# Patient Record
Sex: Female | Born: 1965 | Hispanic: Yes | Marital: Single | Smoking: Never smoker
Health system: Southern US, Community
[De-identification: ages and names within clinical notes are randomized; demographics above are authoritative.]

---

## 2004-12-05 ENCOUNTER — Ambulatory Visit: Payer: Self-pay | Admitting: Family Medicine

## 2005-04-24 ENCOUNTER — Observation Stay: Payer: Self-pay | Admitting: Obstetrics and Gynecology

## 2005-04-25 ENCOUNTER — Inpatient Hospital Stay: Payer: Self-pay | Admitting: Obstetrics and Gynecology

## 2005-06-30 ENCOUNTER — Emergency Department: Payer: Self-pay | Admitting: Emergency Medicine

## 2005-07-02 ENCOUNTER — Emergency Department: Payer: Self-pay | Admitting: Emergency Medicine

## 2008-05-24 ENCOUNTER — Ambulatory Visit: Payer: Self-pay

## 2010-06-06 ENCOUNTER — Ambulatory Visit: Payer: Self-pay | Admitting: Certified Nurse Midwife

## 2010-06-18 ENCOUNTER — Ambulatory Visit: Payer: Self-pay | Admitting: Certified Nurse Midwife

## 2014-02-16 ENCOUNTER — Ambulatory Visit: Payer: Self-pay

## 2017-05-20 ENCOUNTER — Other Ambulatory Visit: Payer: Self-pay | Admitting: Obstetrics and Gynecology

## 2017-05-20 DIAGNOSIS — Z1239 Encounter for other screening for malignant neoplasm of breast: Secondary | ICD-10-CM

## 2017-06-24 ENCOUNTER — Ambulatory Visit
Admission: RE | Admit: 2017-06-24 | Discharge: 2017-06-24 | Disposition: A | Discharge status: Home / Self Care | Payer: BLUE CROSS/BLUE SHIELD | Source: Ambulatory Visit | Attending: Obstetrics and Gynecology | Admitting: Obstetrics and Gynecology

## 2017-06-24 DIAGNOSIS — Z1231 Encounter for screening mammogram for malignant neoplasm of breast: Secondary | ICD-10-CM | POA: Diagnosis present

## 2017-06-24 DIAGNOSIS — R928 Other abnormal and inconclusive findings on diagnostic imaging of breast: Secondary | ICD-10-CM | POA: Diagnosis not present

## 2017-06-24 DIAGNOSIS — N6489 Other specified disorders of breast: Secondary | ICD-10-CM | POA: Diagnosis not present

## 2017-06-24 DIAGNOSIS — Z1239 Encounter for other screening for malignant neoplasm of breast: Secondary | ICD-10-CM

## 2017-06-27 ENCOUNTER — Other Ambulatory Visit: Payer: Self-pay | Admitting: Obstetrics and Gynecology

## 2017-06-27 DIAGNOSIS — R928 Other abnormal and inconclusive findings on diagnostic imaging of breast: Secondary | ICD-10-CM

## 2017-06-27 DIAGNOSIS — N6489 Other specified disorders of breast: Secondary | ICD-10-CM

## 2017-07-11 ENCOUNTER — Ambulatory Visit
Admission: RE | Admit: 2017-07-11 | Discharge: 2017-07-11 | Disposition: A | Discharge status: Home / Self Care | Payer: BLUE CROSS/BLUE SHIELD | Source: Ambulatory Visit | Attending: Obstetrics and Gynecology | Admitting: Obstetrics and Gynecology

## 2017-07-11 DIAGNOSIS — R928 Other abnormal and inconclusive findings on diagnostic imaging of breast: Secondary | ICD-10-CM | POA: Insufficient documentation

## 2017-07-11 DIAGNOSIS — N6489 Other specified disorders of breast: Secondary | ICD-10-CM

## 2018-12-11 IMAGING — MG MM DIGITAL SCREENING BILAT W/ CAD
4 series · 4 of 4 positions shown · non-contrast
Comparison: Previous exam(s).

CLINICAL DATA: Screening.

EXAM:
DIGITAL SCREENING BILATERAL MAMMOGRAM WITH CAD

[R MLO]
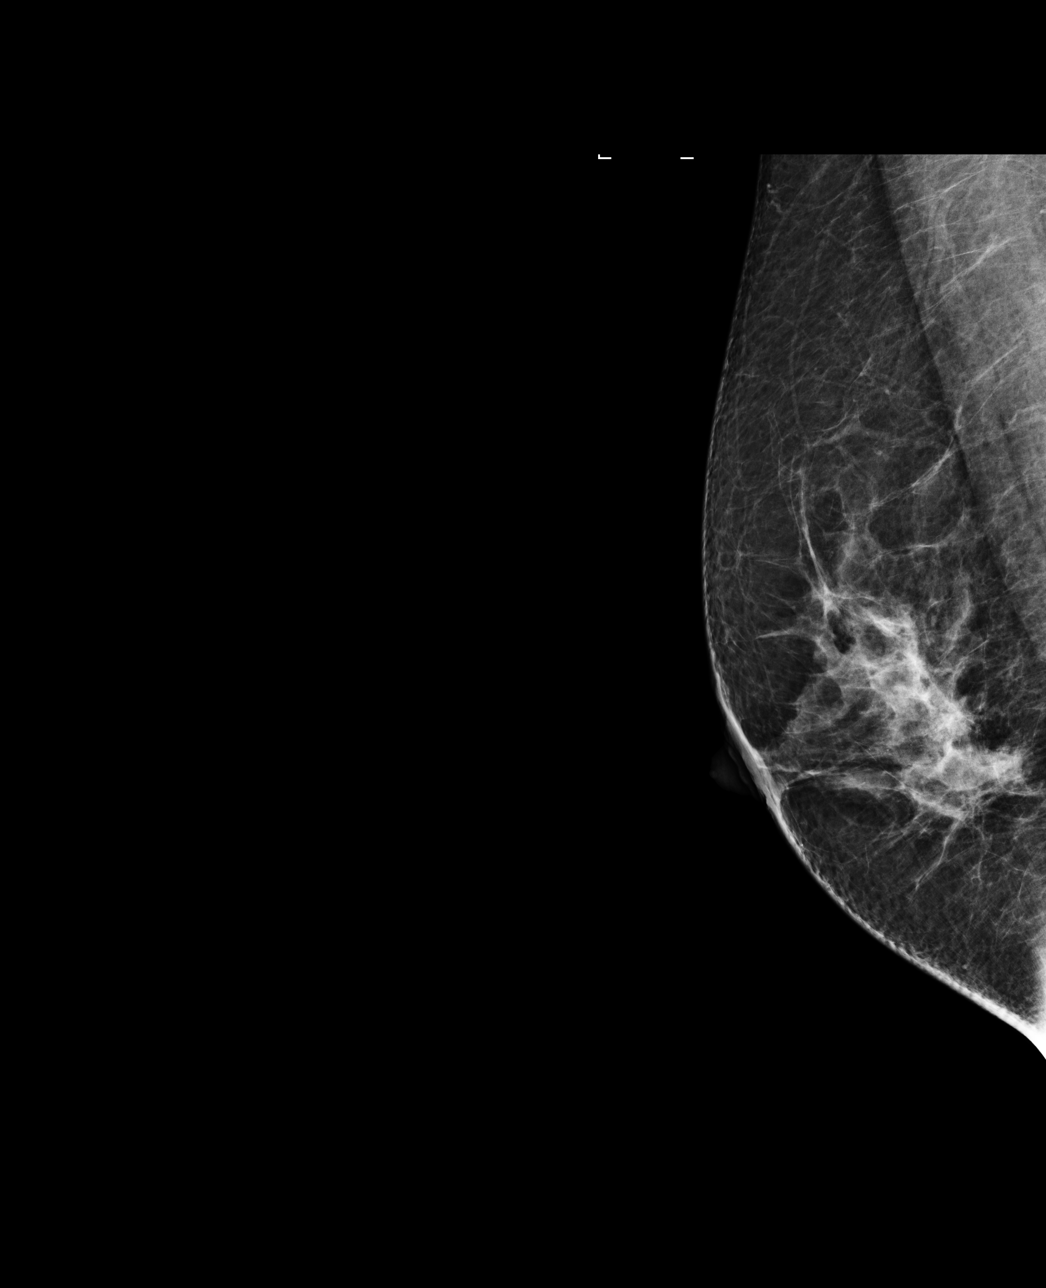

[L MLO]
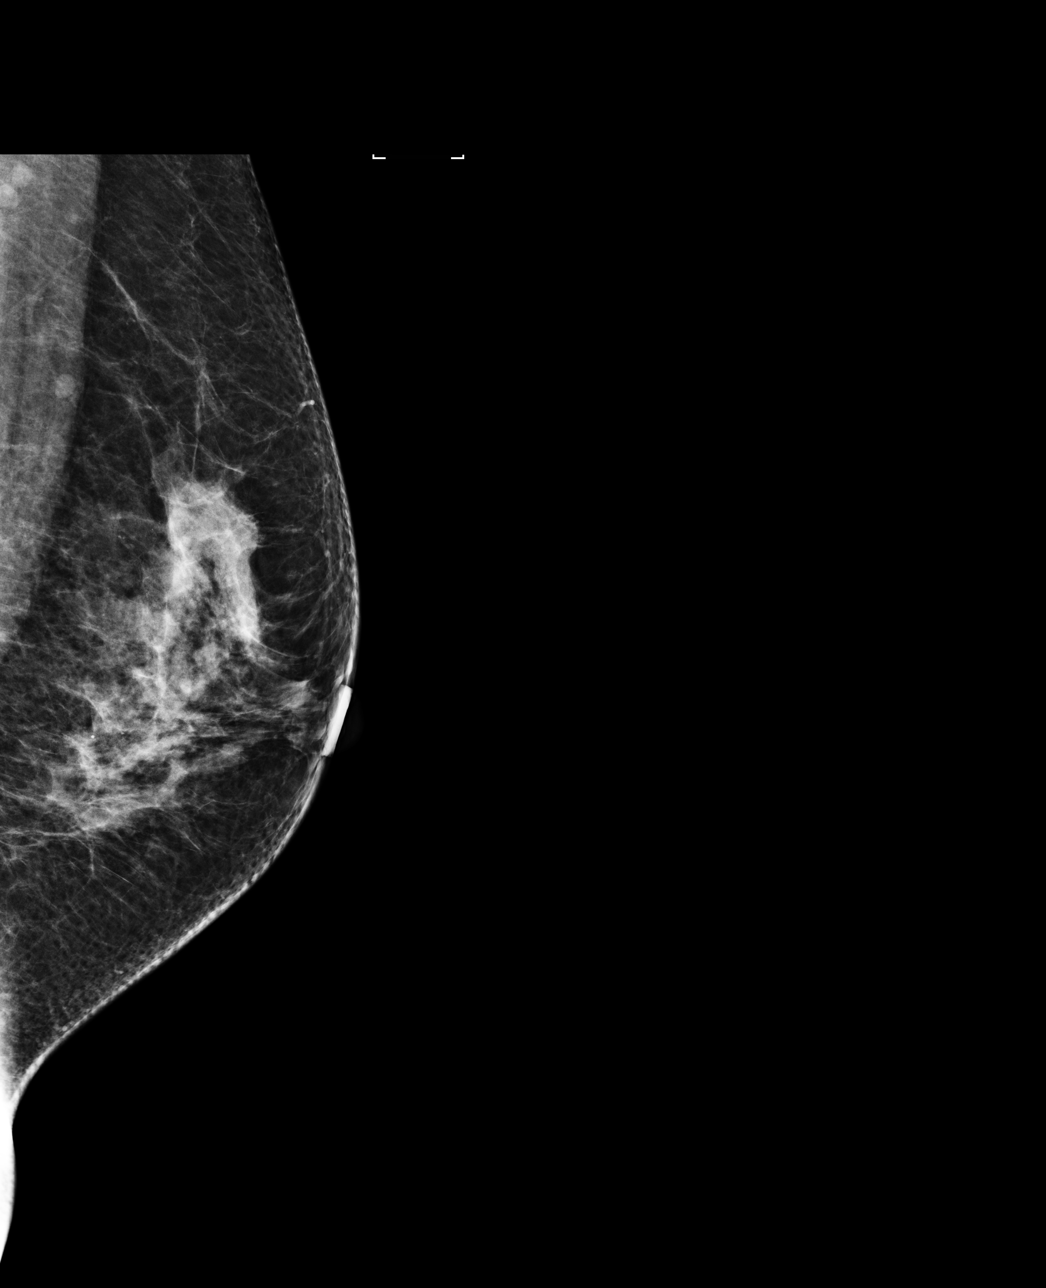

[L CC]
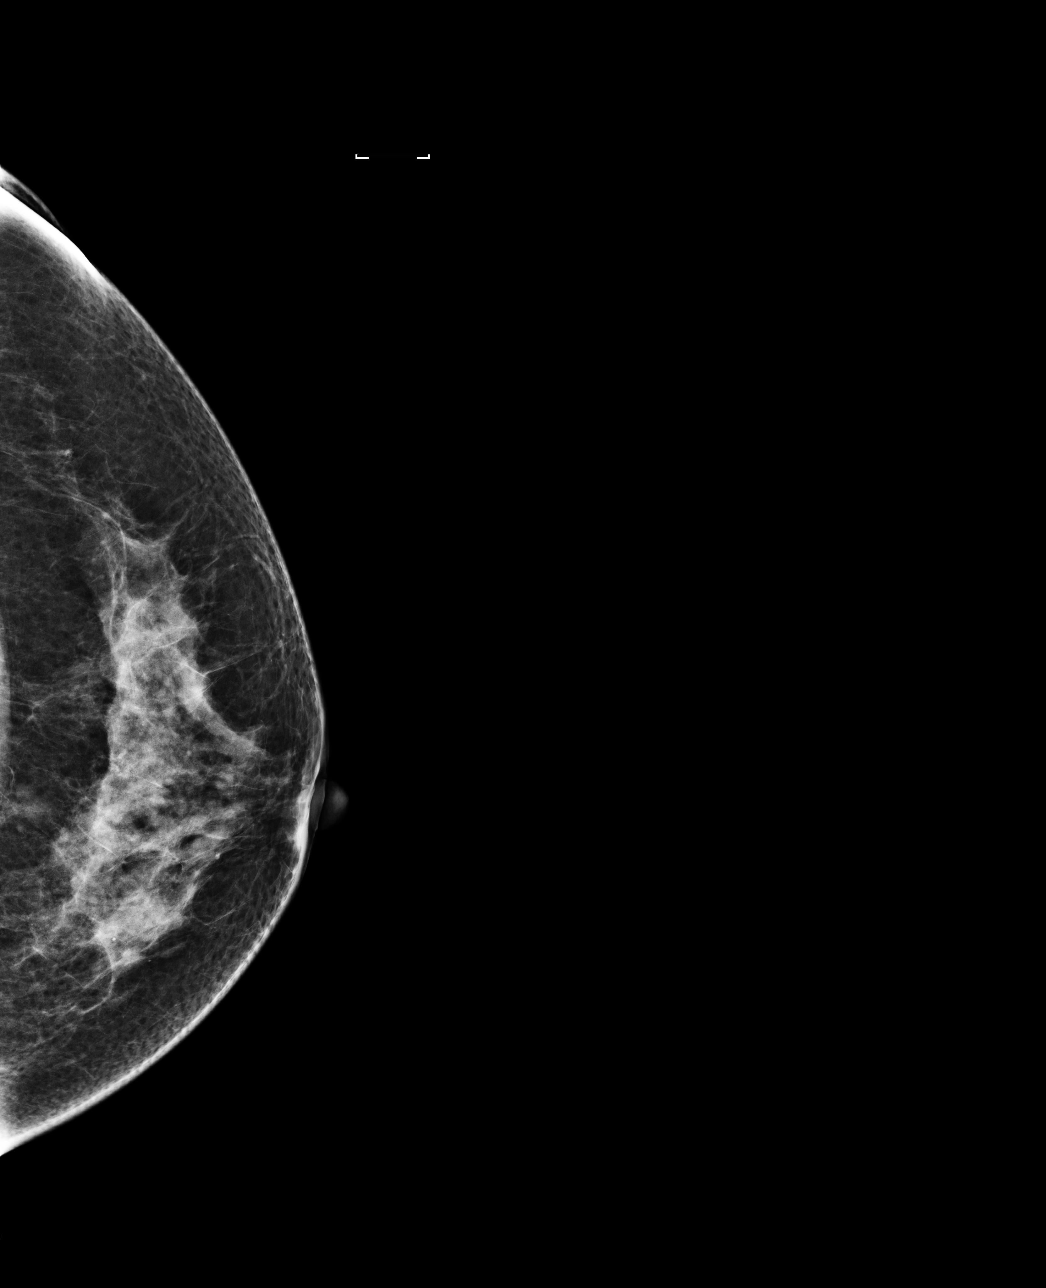

[R CC]
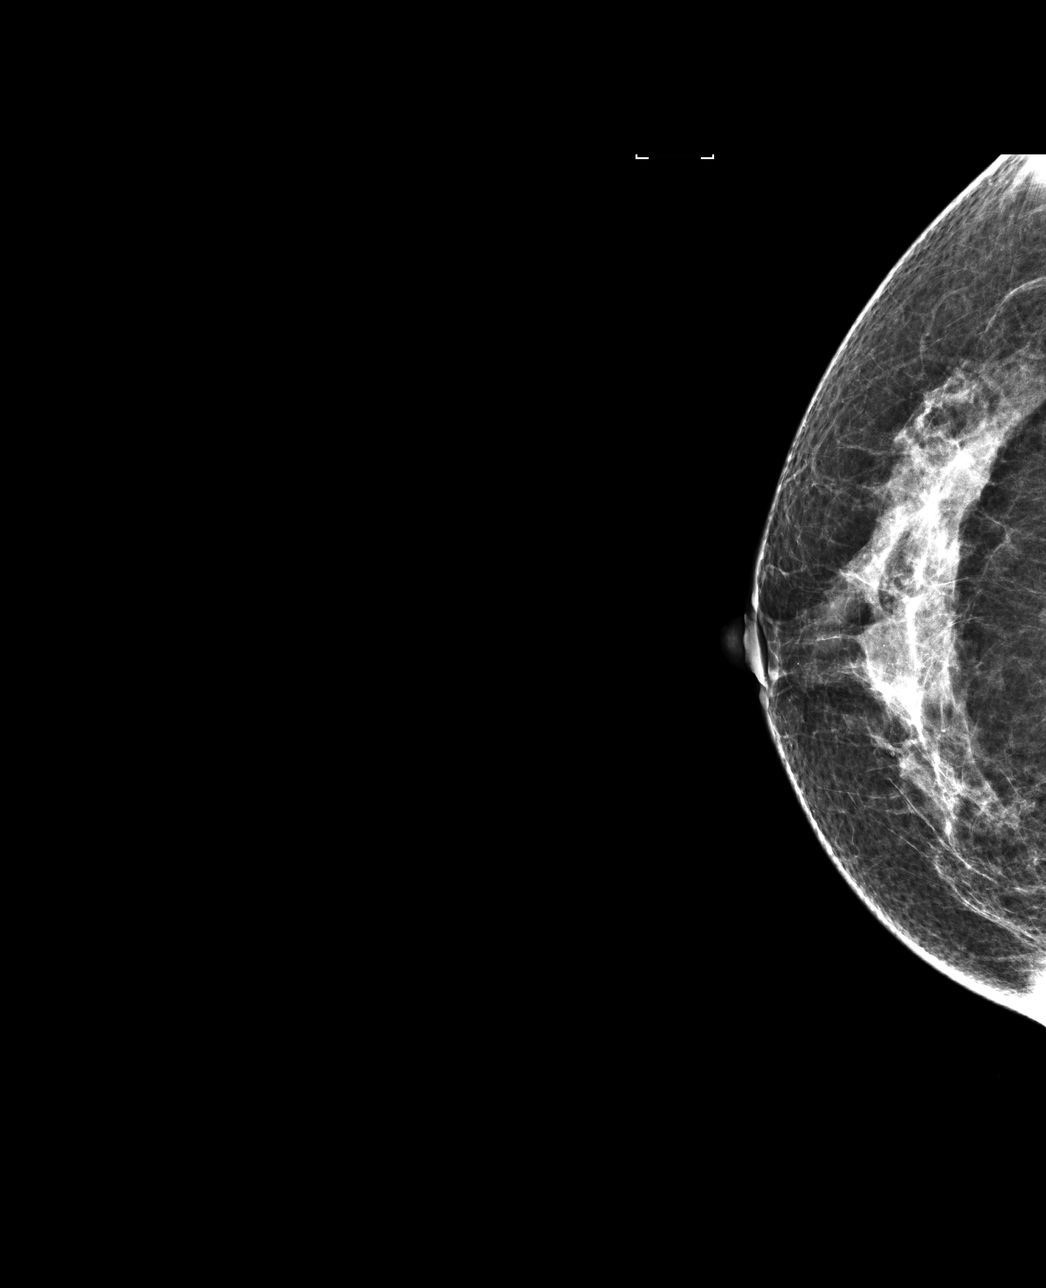

[4 of 4 positions shown; findings below may reference images not displayed]

ACR Breast Density Category c: The breast tissue is heterogeneously
dense, which may obscure small masses.
FINDINGS: In the left breast, a possible asymmetry warrants further
evaluation. In the right breast, no findings suspicious for
malignancy. Images were processed with CAD.
IMPRESSION: Further evaluation is suggested for possible asymmetry in the left
breast.

RECOMMENDATION:
Diagnostic mammogram and possibly ultrasound of the left breast.
(Code:[id number]-WW1)

The patient will be contacted regarding the findings, and additional
imaging will be scheduled.

BI-RADS CATEGORY  0: Incomplete. Need additional imaging evaluation
and/or prior mammograms for comparison.

## 2019-05-11 ENCOUNTER — Encounter: Payer: Self-pay | Admitting: Emergency Medicine

## 2019-05-11 ENCOUNTER — Other Ambulatory Visit: Payer: Self-pay

## 2019-05-11 ENCOUNTER — Emergency Department
Admission: EM | Admit: 2019-05-11 | Discharge: 2019-05-11 | Disposition: A | Discharge status: Home / Self Care | Payer: HRSA Program | Attending: Emergency Medicine | Admitting: Emergency Medicine

## 2019-05-11 DIAGNOSIS — U071 COVID-19: Secondary | ICD-10-CM | POA: Insufficient documentation

## 2019-05-11 DIAGNOSIS — M7918 Myalgia, other site: Secondary | ICD-10-CM | POA: Diagnosis present

## 2019-05-11 DIAGNOSIS — B349 Viral infection, unspecified: Secondary | ICD-10-CM

## 2019-05-11 LAB — BASIC METABOLIC PANEL
Anion gap: 8 (ref 5–15)
BUN: 10 mg/dL (ref 6–20)
CO2: 26 mmol/L (ref 22–32)
Calcium: 9.2 mg/dL (ref 8.9–10.3)
Chloride: 104 mmol/L (ref 98–111)
Creatinine, Ser: 0.74 mg/dL (ref 0.44–1.00)
GFR calc Af Amer: >60 mL/min (ref >60)
GFR calc non Af Amer: >60 mL/min (ref >60)
Glucose, Bld: 100 mg/dL — ABNORMAL HIGH (ref 70–99)
Potassium: 3.9 mmol/L (ref 3.5–5.1)
Sodium: 138 mmol/L (ref 135–145)

## 2019-05-11 LAB — URINALYSIS, COMPLETE (UACMP) WITH MICROSCOPIC
Bacteria, UA: NONE SEEN
Bilirubin Urine: NEGATIVE
Glucose, UA: NEGATIVE mg/dL
Hgb urine dipstick: NEGATIVE
Ketones, ur: NEGATIVE mg/dL
Leukocytes,Ua: NEGATIVE
Nitrite: NEGATIVE
Protein, ur: NEGATIVE mg/dL
Specific Gravity, Urine: 1.01 (ref 1.005–1.030)
pH: 8 (ref 5.0–8.0)

## 2019-05-11 LAB — CBC WITH DIFFERENTIAL/PLATELET
Abs Immature Granulocytes: 0.01 10*3/uL (ref 0.00–0.07)
Basophils Absolute: 0 10*3/uL (ref 0.0–0.1)
Basophils Relative: 0 %
Eosinophils Absolute: 0 10*3/uL (ref 0.0–0.5)
Eosinophils Relative: 0 %
HCT: 37.2 % (ref 36.0–46.0)
Hemoglobin: 12.1 g/dL (ref 12.0–15.0)
Immature Granulocytes: 0 %
Lymphocytes Relative: 23 %
Lymphs Abs: 1.1 10*3/uL (ref 0.7–4.0)
MCH: 28.5 pg (ref 26.0–34.0)
MCHC: 32.5 g/dL (ref 30.0–36.0)
MCV: 87.5 fL (ref 80.0–100.0)
Monocytes Absolute: 0.4 10*3/uL (ref 0.1–1.0)
Monocytes Relative: 8 %
Neutro Abs: 3.2 10*3/uL (ref 1.7–7.7)
Neutrophils Relative %: 69 %
Platelets: 178 10*3/uL (ref 150–400)
RBC: 4.25 MIL/uL (ref 3.87–5.11)
RDW: 13.8 % (ref 11.5–15.5)
WBC: 4.7 10*3/uL (ref 4.0–10.5)
nRBC: 0 % (ref 0.0–0.2)

## 2019-05-11 LAB — HEPATIC FUNCTION PANEL
ALT: 36 U/L (ref 0–44)
AST: 41 U/L (ref 15–41)
Albumin: 4 g/dL (ref 3.5–5.0)
Alkaline Phosphatase: 67 U/L (ref 38–126)
Bilirubin, Direct: <0.1 mg/dL (ref 0.0–0.2)
Total Bilirubin: 0.2 mg/dL — ABNORMAL LOW (ref 0.3–1.2)
Total Protein: 8.1 g/dL (ref 6.5–8.1)

## 2019-05-11 LAB — LIPASE, BLOOD: Lipase: 36 U/L (ref 11–51)

## 2019-05-11 MED ORDER — SODIUM CHLORIDE 0.9 % IV BOLUS
1000.0000 mL | Freq: Once | INTRAVENOUS | Status: AC
  Administered 2019-05-11: 12:00:00 1000 mL via INTRAVENOUS

## 2019-05-11 MED ORDER — KETOROLAC TROMETHAMINE 30 MG/ML IJ SOLN
30.0000 mg | Freq: Once | INTRAMUSCULAR | Status: AC
  Administered 2019-05-11: 30 mg via INTRAVENOUS
  Filled 2019-05-11: qty 1

## 2019-05-11 NOTE — ED Triage Notes (Signed)
  Started Thursday pain all over, head eyes and chills.  Then felt better.  Says chills, fever 101.6 agtain yesterday.

## 2019-05-11 NOTE — ED Provider Notes (Signed)
[facility] Emergency Department Provider Note ____________________________________________   First MD Initiated Contact with Patient 05/11/19 1107     (approximate)  I have reviewed the triage vital signs and the nursing notes.   HISTORY  Chief Complaint Chills; Fever; Cough; and Generalized Body Aches  History of present illness and ROS obtained via video interpreter  HPI Melissa Stephens is a 53 y.o. female with no significant past medical history who presents with chills and body aches, intermittent over the last 4 days, and associated with intermittent fever which was measured to 101.6 yesterday.  She also reports some intermittent upper abdominal pain.  The patient states that after a few days she started to feel better before the symptoms returned yesterday.  She reports some generalized fatigue and malaise.  She denies cough, shortness of breath, vomiting or diarrhea, or urinary symptoms.  No sick contacts or known exposure to anyone with COVID-19.  History reviewed. No pertinent past medical history.  There are no active problems to display for this patient.   History reviewed. No pertinent surgical history.  Prior to Admission medications   Not on File    Allergies Patient has no known allergies.  No family history on file.  Social History Social History   Tobacco Use  . Smoking status: Never Smoker  . Smokeless tobacco: Never Used  Substance Use Topics  . Alcohol use: Never    Frequency: Never  . Drug use: Not on file    Review of Systems  Constitutional: Positive for fever. Eyes: No redness. ENT: No sore throat.  Positive for congestion. Cardiovascular: Denies chest pain. Respiratory: Denies shortness of breath. Gastrointestinal: No vomiting or diarrhea.  Genitourinary: Negative for dysuria.  Musculoskeletal: Negative for back pain.  Positive for myalgias. Skin: Negative for rash. Neurological: Positive for headache.    ____________________________________________   PHYSICAL EXAM:  VITAL SIGNS: ED Triage Vitals  Enc Vitals Group     BP 05/11/19 1046 111/72     Pulse Rate 05/11/19 1046 86     Resp 05/11/19 1046 14     Temp 05/11/19 1046 99.7 F (37.6 C)     Temp Source 05/11/19 1046 Oral     SpO2 05/11/19 1046 99 %     Weight 05/11/19 1048 170 lb (77.1 kg)     Height 05/11/19 1048 5\' 3"  (1.6 m)     Head Circumference --      Peak Flow --      Pain Score 05/11/19 1047 8     Pain Loc --      Pain Edu? --      Excl. in [place]? --     Constitutional: Alert and oriented. Well appearing and in no acute distress. Eyes: Conjunctivae are normal.  Head: Atraumatic. Nose: No congestion/rhinnorhea. Mouth/Throat: Mucous membranes are slightly dry.   Neck: Normal range of motion.  Cardiovascular: Normal rate, regular rhythm. Grossly normal heart sounds.  Good peripheral circulation. Respiratory: Normal respiratory effort.  No retractions. Lungs CTAB. Gastrointestinal: Mild epigastric discomfort to palpation but no focal tenderness.  Nondistended. Musculoskeletal: No lower extremity edema.  Extremities warm and well perfused.  Neurologic:  Normal speech and language. No gross focal neurologic deficits are appreciated.  Skin:  Skin is warm and dry. No rash noted. Psychiatric: Mood and affect are normal. Speech and behavior are normal.  ____________________________________________   LABS (all labs ordered are listed, but only abnormal results are displayed)  Labs Reviewed  BASIC METABOLIC PANEL -  Abnormal; Notable for the following components:      Result Value   Glucose, Bld 100 (*)    All other components within normal limits  HEPATIC FUNCTION PANEL - Abnormal; Notable for the following components:   Total Bilirubin 0.2 (*)    All other components within normal limits  URINALYSIS, COMPLETE (UACMP) WITH MICROSCOPIC - Abnormal; Notable for the following components:   Color, Urine YELLOW (*)     APPearance HAZY (*)    All other components within normal limits  NOVEL CORONAVIRUS, NAA (HOSPITAL ORDER, SEND-OUT TO REF LAB)  CBC WITH DIFFERENTIAL/PLATELET  LIPASE, BLOOD   ____________________________________________  EKG   ____________________________________________  RADIOLOGY    ____________________________________________   PROCEDURES  Procedure(s) performed: No  Procedures  Critical Care performed: No ____________________________________________   INITIAL IMPRESSION / ASSESSMENT AND PLAN / ED COURSE  Pertinent labs & imaging results that were available during my care of the patient were reviewed by me and considered in my medical decision making (see chart for details).  53 year old female with no significant past medical history presents with chills, body aches, and fever over the last 4 days with no cough or respiratory symptoms.  On exam, the patient is very well-appearing.  Her vital signs are normal except for low-grade temperature.  The remainder of the exam is unremarkable.  Overall I suspect most likely viral syndrome.  The patient has no specific risk factors or symptoms suggestive of COVID-19 but given the intermittent fever it is certainly on the differential.  I will obtain basic and hepatobiliary labs, give fluids and Toradol for symptomatic treatment and obtain a send out COVID swab.  ----------------------------------------- 2:40 PM on 05/11/2019 -----------------------------------------  Lab work-up is unremarkable.  The patient is stable for discharge home.  I counseled her on the results of the work-up and return precautions and home isolation guidelines via in-person Spanish interpreter.  __________________________________  Melissa StacksLidia Stephens was evaluated in Emergency Department on 05/11/2019 for the symptoms described in the history of present illness. She was evaluated in the context of the global COVID-19 pandemic, which necessitated  consideration that the patient might be at risk for infection with the SARS-CoV-2 virus that causes COVID-19. Institutional protocols and algorithms that pertain to the evaluation of patients at risk for COVID-19 are in a state of rapid change based on information released by regulatory bodies including the CDC and federal and state organizations. These policies and algorithms were followed during the patient's care in the ED.   ____________________________________________   FINAL CLINICAL IMPRESSION(S) / ED DIAGNOSES  Final diagnoses:  Viral syndrome      NEW MEDICATIONS STARTED DURING THIS VISIT:  New Prescriptions   No medications on file     Note:  This document was prepared using Dragon voice recognition software and may include unintentional dictation errors.    Melissa Stephens, Isa Hitz, MD 05/11/19 1440

## 2019-05-12 ENCOUNTER — Telehealth: Payer: Self-pay | Admitting: Emergency Medicine

## 2019-05-12 NOTE — Telephone Encounter (Signed)
Called patient to inform of positive covid 19 test.  She says she feels better today.  Via [employer].  Patient says she has worked --last on Monday (2 day ago)  Says if she does not report to work she will be dismissed.  She says that there have been others sick at work as well.  I explained cdc guidelines for coming out of isolation.  I explained that her children were exposed and guidelines for them.  I told her that if she is feeling pressured to return to work sick or is concerned that others are not allowed to stay home when sick, she should call the health department for advise on what to do.

## 2019-05-14 LAB — NOVEL CORONAVIRUS, NAA (HOSP ORDER, SEND-OUT TO REF LAB; TAT 18-24 HRS): SARS-CoV-2, NAA: DETECTED — AB

## 2019-06-14 ENCOUNTER — Other Ambulatory Visit: Payer: Self-pay | Admitting: Primary Care

## 2019-06-14 DIAGNOSIS — Z1231 Encounter for screening mammogram for malignant neoplasm of breast: Secondary | ICD-10-CM

## 2019-09-14 ENCOUNTER — Other Ambulatory Visit: Payer: Self-pay | Admitting: Primary Care

## 2019-09-14 DIAGNOSIS — Z1231 Encounter for screening mammogram for malignant neoplasm of breast: Secondary | ICD-10-CM

## 2021-04-11 ENCOUNTER — Encounter: Payer: Self-pay | Admitting: Internal Medicine

## 2021-04-24 ENCOUNTER — Encounter: Payer: Self-pay | Admitting: *Deleted

## 2021-04-24 ENCOUNTER — Ambulatory Visit: Payer: Self-pay | Attending: Oncology | Admitting: *Deleted

## 2021-04-24 ENCOUNTER — Other Ambulatory Visit: Payer: Self-pay

## 2021-04-24 ENCOUNTER — Ambulatory Visit
Admission: RE | Admit: 2021-04-24 | Discharge: 2021-04-24 | Disposition: A | Discharge status: Home / Self Care | Payer: Self-pay | Source: Ambulatory Visit | Attending: Oncology | Admitting: Oncology

## 2021-04-24 VITALS — BP 116/63 | HR 57 | Temp 97.4°F | Ht 63.0 in | Wt 167.2 lb

## 2021-04-24 DIAGNOSIS — Z0000 Encounter for general adult medical examination without abnormal findings: Secondary | ICD-10-CM

## 2021-04-24 NOTE — Progress Notes (Signed)
  Subjective:     Patient ID: Melissa Stephens, female   DOB: Feb 16, 1966, 55 y.o.   MRN: [id number]  HPI   [facility] Medical History Record - 04/24/21 1342      Breast History   Screening cycle New    CBE Date 02/16/14    Provider (CBE) [facility]    Initial Mammogram 04/24/21    Last Mammogram Annual    Last Mammogram Date 07/11/17    Provider (Mammogram)  [facility]    Recent Breast Symptoms None      Breast Cancer History   Breast Cancer History No personal or family history      Previous History of Breast Problems   Breast Surgery or Biopsy None    Breast Implants N/A    BSE Done Monthly      Gynecological/Obstetrical History   LMP 06/24/18    Is there any chance that the client could be pregnant?  No    Age at menarche 80    Age at menopause 38    PAP smear history Annually    Date of last PAP  03/21/21    Provider (PAP) [facility]    Age at first live birth 55    Breast fed children Yes (type length in comments)   5 months   DES Exposure Unkown    Cervical, Uterine or Ovarian cancer No    Family history of Cervial, Uterine or Ovarian cancer Yes   mom, maternal grandmother, materna aunt had uterine cancer in their 1's   Hysterectomy No    Cervix removed No    Ovaries removed No    Laser/Cryosurgery No    Current method of birth control None    Current method of Estrogen/Hormone replacement None    Smoking history None             Review of Systems     Objective:   Physical Exam Chest:  Breasts:     Right: No swelling, bleeding, inverted nipple, mass, nipple discharge, skin change, tenderness, axillary adenopathy or supraclavicular adenopathy.     Left: No swelling, bleeding, inverted nipple, mass, nipple discharge, skin change, tenderness, axillary adenopathy or supraclavicular adenopathy.    Lymphadenopathy:     Upper Body:     Right upper body: No supraclavicular or axillary adenopathy.     Left upper body: No supraclavicular or axillary adenopathy.         Assessment:     55 year old Hispanic female returns to [facility] for annual screening.  [employer] video interpreter Rosey Bath [phone] present during the interview and exam.  Clinical breast exam unremarkable.  Taught self breast awareness.  Last pap on 03/21/21 At the [facility] was negative / negative. Next pap due in 2027.  Patient has been screened for eligibility.  She does not have any insurance, Medicare or Medicaid.  She also meets financial eligibility.   Risk Assessment    Risk Scores      04/24/2021   Last edited by: Jim Like, RN   5-year risk: 0.6 %   Lifetime risk: 4.9 %             Plan:     Screening mammogram ordered.  Will follow up per [facility] protocol.

## 2021-04-26 ENCOUNTER — Encounter: Payer: Self-pay | Admitting: *Deleted

## 2021-04-26 NOTE — Progress Notes (Signed)
Letter mailed from the [facility] to inform patient of her normal mammogram results.  Patient is to follow-up with annual screening in one year. 

## 2022-04-05 ENCOUNTER — Other Ambulatory Visit: Payer: Self-pay | Admitting: Family Medicine

## 2022-04-05 DIAGNOSIS — Z1231 Encounter for screening mammogram for malignant neoplasm of breast: Secondary | ICD-10-CM

## 2023-07-31 ENCOUNTER — Other Ambulatory Visit: Payer: Self-pay | Admitting: Family Medicine

## 2023-07-31 DIAGNOSIS — Z1231 Encounter for screening mammogram for malignant neoplasm of breast: Secondary | ICD-10-CM

## 2024-08-12 ENCOUNTER — Other Ambulatory Visit: Payer: Self-pay | Admitting: Family Medicine

## 2024-08-12 DIAGNOSIS — Z1231 Encounter for screening mammogram for malignant neoplasm of breast: Secondary | ICD-10-CM
